# Patient Record
Sex: Female | Born: 1990 | Race: Black or African American | Hispanic: No | Marital: Single | Smoking: Never smoker
Health system: Southern US, Community
[De-identification: ages and names within clinical notes are randomized; demographics above are authoritative.]

## PROBLEM LIST (undated history)

## (undated) ENCOUNTER — Inpatient Hospital Stay (HOSPITAL_COMMUNITY): Payer: Self-pay

## (undated) HISTORY — PX: CHOLECYSTECTOMY: SHX55

---

## 2018-11-02 ENCOUNTER — Ambulatory Visit (INDEPENDENT_AMBULATORY_CARE_PROVIDER_SITE_OTHER): Payer: Self-pay | Admitting: *Deleted

## 2018-11-02 ENCOUNTER — Encounter: Payer: Self-pay | Admitting: Family Medicine

## 2018-11-02 DIAGNOSIS — Z3201 Encounter for pregnancy test, result positive: Secondary | ICD-10-CM

## 2018-11-02 DIAGNOSIS — Z3A01 Less than 8 weeks gestation of pregnancy: Secondary | ICD-10-CM

## 2018-11-02 LAB — POCT PREGNANCY, URINE: Preg Test, Ur: POSITIVE — AB

## 2018-11-02 NOTE — Progress Notes (Signed)
Pt here to confirm pregnancy. Denies any concerns - no pain or bleeding. LMP 09/18/2018 making her [redacted]w[redacted]d by LMP. She is not taking any meds. Encouraged to make appt to begin [facility] with provider of choice

## 2018-11-02 NOTE — Progress Notes (Signed)
I have reviewed the chart and agree with nursing staff's documentation of this patient's encounter.   Thressa Sheller DNP, CNM  11/02/18  3:47 PM

## 2018-11-11 ENCOUNTER — Inpatient Hospital Stay (HOSPITAL_COMMUNITY): Payer: Self-pay

## 2018-11-11 ENCOUNTER — Inpatient Hospital Stay (HOSPITAL_COMMUNITY)
Admission: AD | Admit: 2018-11-11 | Discharge: 2018-11-11 | Disposition: A | Discharge status: Home / Self Care | Payer: Self-pay | Attending: Obstetrics & Gynecology | Admitting: Obstetrics & Gynecology

## 2018-11-11 ENCOUNTER — Encounter (HOSPITAL_COMMUNITY): Payer: Self-pay | Admitting: *Deleted

## 2018-11-11 ENCOUNTER — Other Ambulatory Visit: Payer: Self-pay

## 2018-11-11 DIAGNOSIS — O26891 Other specified pregnancy related conditions, first trimester: Secondary | ICD-10-CM | POA: Insufficient documentation

## 2018-11-11 DIAGNOSIS — Z3A01 Less than 8 weeks gestation of pregnancy: Secondary | ICD-10-CM | POA: Insufficient documentation

## 2018-11-11 DIAGNOSIS — O26899 Other specified pregnancy related conditions, unspecified trimester: Secondary | ICD-10-CM

## 2018-11-11 DIAGNOSIS — R102 Pelvic and perineal pain: Secondary | ICD-10-CM | POA: Insufficient documentation

## 2018-11-11 DIAGNOSIS — B9689 Other specified bacterial agents as the cause of diseases classified elsewhere: Secondary | ICD-10-CM | POA: Insufficient documentation

## 2018-11-11 DIAGNOSIS — O23591 Infection of other part of genital tract in pregnancy, first trimester: Secondary | ICD-10-CM | POA: Insufficient documentation

## 2018-11-11 DIAGNOSIS — N760 Acute vaginitis: Secondary | ICD-10-CM

## 2018-11-11 LAB — ABO/RH: ABO/RH(D): A POS

## 2018-11-11 LAB — CBC
HCT: 35.3 % — ABNORMAL LOW (ref 36.0–46.0)
Hemoglobin: 11.2 g/dL — ABNORMAL LOW (ref 12.0–15.0)
MCH: 24.9 pg — ABNORMAL LOW (ref 26.0–34.0)
MCHC: 31.7 g/dL (ref 30.0–36.0)
MCV: 78.6 fL — ABNORMAL LOW (ref 80.0–100.0)
Platelets: 248 10*3/uL (ref 150–400)
RBC: 4.49 MIL/uL (ref 3.87–5.11)
RDW: 15.5 % (ref 11.5–15.5)
WBC: 14.4 10*3/uL — ABNORMAL HIGH (ref 4.0–10.5)
nRBC: 0 % (ref 0.0–0.2)

## 2018-11-11 LAB — URINALYSIS, ROUTINE W REFLEX MICROSCOPIC
Bilirubin Urine: NEGATIVE
Glucose, UA: NEGATIVE mg/dL
HGB URINE DIPSTICK: NEGATIVE
Ketones, ur: NEGATIVE mg/dL
Nitrite: NEGATIVE
Protein, ur: NEGATIVE mg/dL
Specific Gravity, Urine: 1.029 (ref 1.005–1.030)
pH: 5 (ref 5.0–8.0)

## 2018-11-11 LAB — WET PREP, GENITAL
Sperm: NONE SEEN
TRICH WET PREP: NONE SEEN
Yeast Wet Prep HPF POC: NONE SEEN

## 2018-11-11 LAB — HCG, QUANTITATIVE, PREGNANCY: hCG, Beta Chain, Quant, S: 40230 m[IU]/mL — ABNORMAL HIGH (ref <5)

## 2018-11-11 MED ORDER — METRONIDAZOLE 500 MG PO TABS: 500.0000 mg | ORAL_TABLET | Freq: Two times a day (BID) | ORAL | 0 refills | Status: AC

## 2018-11-11 NOTE — MAU Note (Addendum)
Pt presents to MAU c/o abdominal pain x2days that is sharp that is in the middle of her abdomen. Pt reports abdominal cramping as well that is lower. Pt denies bleeding or discharge. Pt reports she felt as if she was going to faint last night but not today.   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 Pt requesting help or possible shelter reference.   Triage Vitals:  BP: 119/67 Temp: 98.6 Pulse: 93 Pain: 0 right now  Weight:266lbs

## 2018-11-11 NOTE — Discharge Instructions (Signed)
First Trimester of Pregnancy ° °The first trimester of pregnancy is from week 1 until the end of week 13 (months 1 through 3). During this time, your baby will begin to develop inside you. At 6-8 weeks, the eyes and face are formed, and the heartbeat can be seen on ultrasound. At the end of 12 weeks, all the baby's organs are formed. Prenatal care is all the medical care you receive before the birth of your baby. Make sure you get good prenatal care and follow all of your doctor's instructions. °Follow these instructions at home: °Medicines °· Take over-the-counter and prescription medicines only as told by your doctor. Some medicines are safe and some medicines are not safe during pregnancy. °· Take a prenatal vitamin that contains at least 600 micrograms (mcg) of folic acid. °· If you have trouble pooping (constipation), take medicine that will make your stool soft (stool softener) if your doctor approves. °Eating and drinking ° °· Eat regular, healthy meals. °· Your doctor will tell you the amount of weight gain that is right for you. °· Avoid raw meat and uncooked cheese. °· If you feel sick to your stomach (nauseous) or throw up (vomit): °? Eat 4 or 5 small meals a day instead of 3 large meals. °? Try eating a few soda crackers. °? Drink liquids between meals instead of during meals. °· To prevent constipation: °? Eat foods that are high in fiber, like fresh fruits and vegetables, whole grains, and beans. °? Drink enough fluids to keep your pee (urine) clear or pale yellow. °Activity °· Exercise only as told by your doctor. Stop exercising if you have cramps or pain in your lower belly (abdomen) or low back. °· Do not exercise if it is too hot, too humid, or if you are in a place of great height (high altitude). °· Try to avoid standing for long periods of time. Move your legs often if you must stand in one place for a long time. °· Avoid heavy lifting. °· Wear low-heeled shoes. Sit and stand up  straight. °· You can have sex unless your doctor tells you not to. °Relieving pain and discomfort °· Wear a good support bra if your breasts are sore. °· Take warm water baths (sitz baths) to soothe pain or discomfort caused by hemorrhoids. Use hemorrhoid cream if your doctor says it is okay. °· Rest with your legs raised if you have leg cramps or low back pain. °· If you have puffy, bulging veins (varicose veins) in your legs: °? Wear support hose or compression stockings as told by your doctor. °? Raise (elevate) your feet for 15 minutes, 3-4 times a day. °? Limit salt in your food. °Prenatal care °· Schedule your prenatal visits by the twelfth week of pregnancy. °· Write down your questions. Take them to your prenatal visits. °· Keep all your prenatal visits as told by your doctor. This is important. °Safety °· Wear your seat belt at all times when driving. °· Make a list of emergency phone numbers. The list should include numbers for family, friends, the hospital, and police and fire departments. °General instructions °· Ask your doctor for a referral to a local prenatal class. Begin classes no later than at the start of month 6 of your pregnancy. °· Ask for help if you need counseling or if you need help with nutrition. Your doctor can give you advice or tell you where to go for help. °· Do not use hot tubs, steam   rooms, or saunas.  Do not douche or use tampons or scented sanitary pads.  Do not cross your legs for long periods of time.  Avoid all herbs and alcohol. Avoid drugs that are not approved by your doctor.  Do not use any tobacco products, including cigarettes, chewing tobacco, and electronic cigarettes. If you need help quitting, ask your doctor. You may get counseling or other support to help you quit.  Avoid cat litter boxes and soil used by cats. These carry germs that can cause birth defects in the baby and can cause a loss of your baby (miscarriage) or stillbirth.  Visit your dentist.  At home, brush your teeth with a soft toothbrush. Be gentle when you floss. Contact a doctor if:  You are dizzy.  You have mild cramps or pressure in your lower belly.  You have a nagging pain in your belly area.  You continue to feel sick to your stomach, you throw up, or you have watery poop (diarrhea).  You have a bad smelling fluid coming from your vagina.  You have pain when you pee (urinate).  You have increased puffiness (swelling) in your face, hands, legs, or ankles. Get help right away if:  You have a fever.  You are leaking fluid from your vagina.  You have spotting or bleeding from your vagina.  You have very bad belly cramping or pain.  You gain or lose weight rapidly.  You throw up blood. It may look like coffee grounds.  You are around people who have [place] measles, fifth disease, or chickenpox.  You have a very bad headache.  You have shortness of breath.  You have any kind of trauma, such as from a fall or a car accident. Summary  The first trimester of pregnancy is from week 1 until the end of week 13 (months 1 through 3).  To take care of yourself and your unborn baby, you will need to eat healthy meals, take medicines only if your doctor tells you to do so, and do activities that are safe for you and your baby.  Keep all follow-up visits as told by your doctor. This is important as your doctor will have to ensure that your baby is healthy and growing well. This information is not intended to replace advice given to you by your health care provider. Make sure you discuss any questions you have with your health care provider. Document Released: 01/29/2008 Document Revised: 08/20/2016 Document Reviewed: 08/20/2016 Elsevier Interactive Patient Education  2019 [employer]. [facility] Guide (Revised August 2014)    Insufficient Money for Medicine:           [address]: call "211"    MAP Program at [facility] Department - [place]  [phone] or [place] [phone]            No Primary Care Doctor:  To locate a primary care doctor that accepts your insurance or provides certain services:           [place] [facility]: [phone]           Physician Referral Service: [phone] ask for My [place]  If no insurance, you need to see if you qualify for [facility] orange card, call to set      up appointment for eligibility/enrollment at [phone] or [phone] or visit [facility]. of Health and [employer] ([address], [place] and [address] [place] -[place]) to meet with a [facility] enrollment specialist.  Agencies that  provide inexpensive (sliding fee scale) medical care:       [facility] - Family Medicine at [facility] [phone]     [facility]  -  [facility] Adult Center [phone]     [facility] Internal Medicine - [phone]     [facility] Care & Wellness - [phone]     [facility] [phone]     [facility] Family Practice [phone]  [facility] - [facility] Child Health @ [facility] - [phone]-     [phone]  [facility] - [facility] @ [place] - [phone]  [facility]: [phone]   Women's Clinic: [phone]   Planned Parenthood: [phone]   [facility] of the [place]    Medicaid-accepting [facility] Providers:           [facility] - [phone] (No Family Planning accepted)          [address], [phone], Mon-Fri 9am-5pm          [facility] - [phone]  [address] [place], [address] [place], Mon-Thursday 8am-5pm, Fri 8am-noon  [employer] - [phone]          [address], Mon-Fri 7:30am-4:30pm          [facility] - [phone]          [address] [place] 8am-5pm           [facility] - [phone] [address]          Only accepts [place] [employer] patients after they have their name applied to their card  Self Pay (no insurance) in [facility]:           Sickle Cell Patients:   [address] [place], [phone] [facility] Internal Medicine:  [address] [place] [phone]       [facility] and Wellness  [address] [place] [phone]  [facility] Family Practice:  [address], [phone]          [facility] Urgent Care           [address] [place], [phone] [facility] for Children  [address] [place], [phone]           [facility]gent [facility] [place]           [address], [place] [phone]        [facility] - [address]           [phone], Mon-Fri 9am-7pm, [place] 9am-1pm          [facility] - Family Medicine @ [facility]          [address] [place], [phone]          [facility] - [facility]           [address] [phone] [facility] - [facility]  [address] [place] [phone]          [facility]  [address], [phone]  [facility] - [facility]   [address] [place], [phone] [facility] - [facility]  [address] [phone]  Dr. Julio Sicks           [address], [place], [phone]          [facility] Urgent Care           [address] [phone]          [facility]             [address], [phone]          [facility]           [address] Medicine [place], [phone], 1st & 3rd Saturday every month, 10am-1pm OTHERS:  [employer]  (Immigration [employer] Only)  [phone]  (Thursday only) Strategies for finding a Primary Care Provider:  1) Find a Doctor and Pay Out of Pocket  Although you won't have to find out who is covered by your insurance plan, it is a good idea to ask around and get recommendations. You will then need to call the office and see if the doctor you have chosen will accept you as a new patient and what types of options they offer for patients who are self-pay. Some doctors offer discounts or will set up payment plans for their patients who do not have insurance, but you will need to ask so you aren't surprised when you get to your appointment.  2) Contact [place] [employer] - To see if you qualify for orange card access to healthcare safety net providers.  Call for appointment for eligibility/enrollment at [phone] or [phone]. (Uninsured, 0-200% FPL, qualifying info)  Applicants for [facility] are first required to see if they are eligible to enroll in the [facility] Marketplace before enrolling in [facility] (and get an exemption if they are not).  GCCN Criteria for acceptance is:  ? Proof of ACA Marketing exemption - form or documentation  ? Valid photo ID (driver's license, state identification card, passport, home country ID)  ? Proof of [facility] residency (e.g. drivers license, lease/landlord information, pay stubs with address, utility bill, bank statement, etc.)  ? Proof of income (1040, last year's tax return, W2, 4 current pay stubs, other income proof)  ? Proof of assets (current bank statement + 3 most recent, disability paperwork, life insurance info, tax value on autos, etc.)  3) Contact Your Local Health Department  Not all health departments have doctors that can see patients for sick visits, but many do, so it is worth a call to see if yours does. If you don't know where your local health department is, you can check in your phone book. The CDC also has a tool to help you locate your state's health department, and many  state websites also have listings of all of their local health departments.  4) Find a Walk-in Clinic  If your illness is not likely to be very severe or complicated, you may want to try a walk in clinic. These are popping up all over the country in pharmacies, drugstores, and shopping centers. They're usually staffed by nurse practitioners or physician assistants that have been trained to treat common illnesses and complaints. They're usually fairly quick and inexpensive. However, if you have serious medical issues or chronic medical problems, these are probably not  your best option   STD Testing:           [facility] [place], [facility] [facility]           [address], [place], phone [phone] or [phone]           Monday - Friday, call for an appointment          [facility] Department of [facility], [place] [facility]           [address], [place], phone [phone] or [phone]           Monday - Friday, call for an appointment Abuse/Neglect:           [facility] Child Abuse Hotline: 937-883-5524           [facility] Child Abuse Hotline: 312-388-1241 (After Hours) Emergency Shelter:  [facility] 564-292-8125  [facility] [place]- 956-286-5268  [facility] [place] - 307-117-0376  Youth Focus - Act Together - 807-008-1304 (ages 17-17)  Homeless Day Shelter @ [employer] - 204-782-4877   Mammograms - Free at [facility] [phone]  Maternity Homes:           Room at the [place] of the [facility]: [phone]   (Homeless mother with children)          [facility] Alert Services: [phone] (Mothers only)  Youth Focus: 780-388-9219 (Pregnant 50-4 years old)  Adopt a Mom -([phone]  [facility]   [facility] - Lanae Boast  [address] [place] [phone]          [facility]            [address] [place]           [phone]          [place]           [address] [place], [place]           [phone]          [facility] Dept.           [address], [place]           [phone]          [facility] Mental Health           [phone]          [facility]           [phone]          [facility] in [place]           [address]           [phone], [facility] Child Abuse Hotline           (304)293-7565           313-685-3370 (After Hours)  Behavioral Health Services /Substance Abuse Resources:           Alcohol and Drug Services: 2130265150           [facility]: [phone]          [facility]: [phone]   Narcotics Helpline - 7140711201  [facility]: 431-414-0011(336) [phone]           Residential & Outpatient Substance Abuse Program - [facility]: [phone]  [employer]  Behavioral Health and [facility] - [phone] Psychological Services:          [facility]: [phone]   Therapeutic Alternatives: [phone]          [facility]           [address] [place] [address], [place]           ACCESS LINE: [phone]     (24 Hour)  Mobile Crisis:   HELPLINES:  Financial risk analystational Alliance on Mental Illness - [place] 6181490522(336) [phone] [facility] - [place]  6505496320(800) [phone]  [facility] Services       [facility] - [address] - [place]  [phone]       [facility] - [phone] or [phone](336) [phone]  [facility] - [phone] S. [address] - [place] [phone]  [facility] [phone]- [address], [place] [phone](336) [phone]   Dental Assistance:  If unable to pay or uninsured, contact: [facility] to become qualified for the adult dental clinic.  Patient must be enrolled in [facility] (uninsured, 0-200% FPL, qualifying info).  Enroll in [facility] first, then see Primary Care Physician assigned to you, the PCP makes a dental referral. [place] [facility] will receive referral and contacts patient for appointment.  Patients with Medicaid           [address], [phone]  [facility] (Children up to 20 + Pregnant Women) - [phone](336) [phone]  [facility] - [address] - Suite [phone]  If unable to pay, or uninsured: contact [facility] [facility] [phone] in [place] - (Children only + Pregnant Women), [phone] in [facility]- Children only) to become qualified for the adult dental clinic  Must see if eligible to enroll in [facility] Health Insurance Marketplace before enrolling into the [facility] (exemption required) [phone] for an appointment)  BigFaster.co.ukwww.healthcare.gov;   [phone].  If not eligible for ACA, then go by Department of Health and Human Services to see if eligible for orange card.  [address], [place] and [phone]- [address].  Once you get an orange card, you will have a Primary Care home who will then refer you to dental if needed.     Other IT consultantLow-Cost Community Dental Services:   [facility] [phone]- [phone] (ext [phone])   [address]  Dr. Lawrence Marseillesivils - [phone]   [address]    [place] [employer] - [phone]   [address] [facility] [address]           [facility]           [address], [place], [place], [id number]           [phone], Ext. [phone]           2nd and 4th Thursday of the month at 6:30am (Simple extractions only - no wisdom teeth or surgery) First come/First serve -First 10 clients served           [facility]([place], [place] and [facility] [place] residents only)          [address], [place], [place], [address]           [phone]  [facility] Health Department           [phone]          [facility] Health Department          [phone]         [facility] Health Department - [facility]          [phone]   Transportation Options:  Ambulance - 911 - [phone] per ride Family Member to accompany patient (if stable) [place] Transit Authority - [phone]  PART - [phone]  Taxi - [phone] - [place]  SCAT - [phone] (Application required)  [facility] - [phone]

## 2018-11-11 NOTE — MAU Provider Note (Signed)
Chief Complaint: Abdominal Pain   First Provider Initiated Contact with Patient 11/11/18 2023        SUBJECTIVE HPI: Andrea Gutierrez is a 28 y.o. G1P0 at [redacted]w[redacted]d by LMP who presents to maternity admissions reporting intermittent sharp pains in Left lower abdomen and sometimes in epigastric area.  No bleeding.. She denies vaginal bleeding, vaginal itching/burning, urinary symptoms, h/a, dizziness, n/v, or fever/chills.    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  [facility] is not being supportive  Abdominal Pain  This is a new problem. The current episode started in the past 7 days. The onset quality is gradual. The problem occurs intermittently. The problem has been unchanged. The pain is mild. The quality of the pain is cramping. The abdominal pain does not radiate. Pertinent negatives include no constipation, diarrhea, dysuria, fever, frequency or headaches. Nothing aggravates the pain. The pain is relieved by nothing. She has tried nothing for the symptoms.   RN Note: Pt presents to MAU c/o abdominal pain x2days that is sharp that is in the middle of her abdomen. Pt reports abdominal cramping as well that is lower. Pt denies bleeding or discharge. Pt reports she felt as if she was going to faint last night but not today.   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 Pt requesting help or possible shelter reference.    History reviewed. No pertinent past medical history. Past Surgical History:  Procedure Laterality Date  . CHOLECYSTECTOMY     Social History   Socioeconomic History  . Marital status: Single    Spouse name: Not on file  . Number of children: Not on file  . Years of education: Not on file  . Highest education level: Not on file  Occupational History  . Not on file  Social Needs  . Financial resource strain: Not on file  . Food  insecurity:    Worry: Not on file    Inability: Not on file  . Transportation needs:    Medical: Not on file    Non-medical: Not on file  Tobacco Use  . Smoking status: Never Smoker  . Smokeless tobacco: Never Used  Substance and Sexual Activity  . Alcohol use: Never    Frequency: Never  . Drug use: Never  . Sexual activity: Yes    Birth control/protection: None  Lifestyle  . Physical activity:    Days per week: Not on file    Minutes per session: Not on file  . Stress: Not on file  Relationships  . Social connections:    Talks on phone: Not on file    Gets together: Not on file    Attends religious service: Not on file    Active member of club or organization: Not on file    Attends meetings of clubs or organizations: Not on file    Relationship status: Not on file  . Intimate partner violence:    Fear of current or ex partner: Not on file    Emotionally abused: Not on file    Physically abused: Not on file    Forced sexual activity: Not on file  Other Topics Concern  . Not on file  Social History Narrative  . Not on file   No current facility-administered medications on file prior to encounter.    No  current outpatient medications on file prior to encounter.   No Known Allergies  I have reviewed patient's Past Medical Hx, Surgical Hx, Family Hx, Social Hx, medications and allergies.   ROS:  Review of Systems  Constitutional: Negative for fever.  Gastrointestinal: Positive for abdominal pain. Negative for constipation and diarrhea.  Genitourinary: Negative for dysuria and frequency.  Neurological: Negative for headaches.   Review of Systems  Other systems negative   Physical Exam  Physical Exam Patient Vitals for the past 24 hrs:  BP Temp Temp src Pulse Resp Weight  11/11/18 1951 119/67 98.6 F (37 C) Oral 93 17 120.7 kg   Constitutional: Well-developed, well-nourished female in no acute distress.  Cardiovascular: normal rate Respiratory: normal  effort GI: Abd soft, non-tender. Pos BS x 4 MS: Extremities nontender, no edema, normal ROM Neurologic: Alert and oriented x 4.  GU: Neg CVAT.  PELVIC EXAM: Cervix pink, visually closed, without lesion, scant white creamy discharge, vaginal walls and external genitalia normal Bimanual exam: Cervix 0/long/high, firm, anterior, neg CMT, uterus nontender, nonenlarged, adnexa without tenderness, enlargement, or mass  LAB RESULTS Results for orders placed or performed during the hospital encounter of 11/11/18 (from the past 24 hour(s))  Urinalysis, Routine w reflex microscopic     Status: Abnormal   Collection Time: 11/11/18  7:46 PM  Result Value Ref Range   Color, Urine YELLOW YELLOW   APPearance HAZY (A) CLEAR   Specific Gravity, Urine 1.029 1.005 - 1.030   pH 5.0 5.0 - 8.0   Glucose, UA NEGATIVE NEGATIVE mg/dL   Hgb urine dipstick NEGATIVE NEGATIVE   Bilirubin Urine NEGATIVE NEGATIVE   Ketones, ur NEGATIVE NEGATIVE mg/dL   Protein, ur NEGATIVE NEGATIVE mg/dL   Nitrite NEGATIVE NEGATIVE   Leukocytes,Ua MODERATE (A) NEGATIVE   RBC / HPF 0-5 0 - 5 RBC/hpf   WBC, UA 11-20 0 - 5 WBC/hpf   Bacteria, UA RARE (A) NONE SEEN   Squamous Epithelial / LPF 0-5 0 - 5   Mucus PRESENT   hCG, quantitative, pregnancy     Status: Abnormal   Collection Time: 11/11/18  8:52 PM  Result Value Ref Range   hCG, Beta Chain, Quant, S 40,230 (H) <5 mIU/mL  CBC     Status: Abnormal   Collection Time: 11/11/18  8:52 PM  Result Value Ref Range   WBC 14.4 (H) 4.0 - 10.5 K/uL   RBC 4.49 3.87 - 5.11 MIL/uL   Hemoglobin 11.2 (L) 12.0 - 15.0 g/dL   HCT 79.3 (L) 90.3 - 00.9 %   MCV 78.6 (L) 80.0 - 100.0 fL   MCH 24.9 (L) 26.0 - 34.0 pg   MCHC 31.7 30.0 - 36.0 g/dL   RDW 23.3 00.7 - 62.2 %   Platelets 248 150 - 400 K/uL   nRBC 0.0 0.0 - 0.2 %  ABO/Rh     Status: None   Collection Time: 11/11/18  8:52 PM  Result Value Ref Range   ABO/RH(D) A POS    No rh immune globuloin      NOT A RH IMMUNE GLOBULIN  CANDIDATE, PT RH POSITIVE Performed at [facility], [address], [place], [place] [address]   Wet prep, genital     Status: Abnormal   Collection Time: 11/11/18 10:41 PM  Result Value Ref Range   Yeast Wet Prep HPF POC NONE SEEN NONE SEEN   Trich, Wet Prep NONE SEEN NONE SEEN   Clue Cells Wet Prep HPF POC  PRESENT (A) NONE SEEN   WBC, Wet Prep HPF POC MANY (A) NONE SEEN   Sperm NONE SEEN      IMAGING US Ob Comp Less 14 Wks  Result Date: 11/11/2018 CLINICAL DATA:  Pelvic pain for the past 2 days. Estimated gestational age of [redacted] weeks, 5 days by LMP. EXAM: OBSTETRIC <14 WK [place] AND TRANSVAGINAL OB US TECHNIQUE: Both transabdominal and transvaginal ultrasound examinations were performed for complete evaluation of the gestation as well as the maternal uterus, adnexal regions, and pelvic cul-de-sac. Transvaginal technique was performed to assess early pregnancy. COMPARISON:  None. FINDINGS: Intrauterine gestational sac: Single Yolk sac:  Visualized. Embryo:  Visualized. Cardiac Activity: Visualized. Heart Rate: 121 bpm CRL:  8.4 mm   6 w   5 d                  [place] EDC: 07/02/2019 Subchorionic hemorrhage:  None visualized. Maternal uterus/adnexae: Unremarkable.  Right corpus luteum. IMPRESSION: 1. Single, live intrauterine pregnancy with estimated gestational age of [redacted] weeks, 5 days. No acute abnormality. Electronically Signed   By: Obie Dredge M.D.   On: 11/11/2018 22:01   US Ob Transvaginal  Result Date: 11/11/2018 CLINICAL DATA:  Pelvic pain for the past 2 days. Estimated gestational age of [redacted] weeks, 5 days by LMP. EXAM: OBSTETRIC <14 WK [place] AND TRANSVAGINAL OB US TECHNIQUE: Both transabdominal and transvaginal ultrasound examinations were performed for complete evaluation of the gestation as well as the maternal uterus, adnexal regions, and pelvic cul-de-sac. Transvaginal technique was performed to assess early pregnancy. COMPARISON:  None. FINDINGS: Intrauterine gestational sac: Single Yolk  sac:  Visualized. Embryo:  Visualized. Cardiac Activity: Visualized. Heart Rate: 121 bpm CRL:  8.4 mm   6 w   5 d                  [place] EDC: 07/02/2019 Subchorionic hemorrhage:  None visualized. Maternal uterus/adnexae: Unremarkable.  Right corpus luteum. IMPRESSION: 1. Single, live intrauterine pregnancy with estimated gestational age of [redacted] weeks, 5 days. No acute abnormality. Electronically Signed   By: Obie Dredge M.D.   On: 11/11/2018 22:01    MAU Management/MDM: Ordered usual first trimester r/o ectopic labs.   Pelvic exam and cultures done Will check baseline Ultrasound to rule out ectopic.  This bleeding/pain can represent a normal pregnancy with bleeding, spontaneous abortion or even an ectopic which can be life-threatening.  The process as listed above helps to determine which of these is present.  Reviewed results of above with patient who is pleased List of agencies for help with housing given List of providers given Encouraged to seek prenatal care Picture given   ASSESSMENT Single intrauterine pregnancy at [redacted]w[redacted]d Pelvic pain in early pregnancy Bacterial vaginosis  PLAN Discharge home List of providers given Encouraged to seek prenatal care  Pt stable at time of discharge. Encouraged to return here or to other Urgent Care/ED if she develops worsening of symptoms, increase in pain, fever, or other concerning symptoms.    Wynelle Bourgeois CNM, MSN Certified Nurse-Midwife 11/11/2018  8:55 PM

## 2018-11-12 LAB — HIV ANTIBODY (ROUTINE TESTING W REFLEX): HIV Screen 4th Generation wRfx: NONREACTIVE

## 2018-11-14 LAB — GC/CHLAMYDIA PROBE AMP (~~LOC~~) NOT AT ARMC
Chlamydia: NEGATIVE
Neisseria Gonorrhea: NEGATIVE

## 2018-12-08 ENCOUNTER — Ambulatory Visit (INDEPENDENT_AMBULATORY_CARE_PROVIDER_SITE_OTHER): Payer: Self-pay | Admitting: *Deleted

## 2018-12-08 DIAGNOSIS — Z3400 Encounter for supervision of normal first pregnancy, unspecified trimester: Secondary | ICD-10-CM

## 2018-12-08 NOTE — Progress Notes (Signed)
[id number]  Called pt for New Ob intake phone appt and left message on her VM detailing the reason for my call.  [id number]  Second call to pt for scheduled phone appointment - got VM again. New Ob intake appointment will be rescheduled.

## 2018-12-21 ENCOUNTER — Telehealth: Payer: Self-pay | Admitting: Advanced Practice Midwife

## 2018-12-21 NOTE — Telephone Encounter (Signed)
The patient called in regarding the change in appointment. Stated she noticed a change via mychart however she did not talk to anyone. Found the new ob intake was no completed. Cancelled the appointment and informed the patient of being placed on the wait list for the next available intake. Apologized for the inconvience.

## 2018-12-22 ENCOUNTER — Encounter: Payer: Self-pay | Admitting: Student

## 2019-01-01 ENCOUNTER — Other Ambulatory Visit: Payer: Self-pay

## 2019-01-01 ENCOUNTER — Ambulatory Visit (INDEPENDENT_AMBULATORY_CARE_PROVIDER_SITE_OTHER): Payer: Self-pay | Admitting: General Practice

## 2019-01-01 DIAGNOSIS — Z3402 Encounter for supervision of normal first pregnancy, second trimester: Secondary | ICD-10-CM

## 2019-01-01 DIAGNOSIS — Z3400 Encounter for supervision of normal first pregnancy, unspecified trimester: Secondary | ICD-10-CM | POA: Insufficient documentation

## 2019-01-01 MED ORDER — PRENATAL PLUS 27-1 MG PO TABS: 1.0000 | ORAL_TABLET | Freq: Every day | ORAL | 11 refills | Status: AC

## 2019-01-01 NOTE — Progress Notes (Signed)
I connected with  Andrea Gutierrez on 01/01/19 at  8:15 AM EDT by [employer] and verified that I am speaking with the correct person using two identifiers.   I discussed the limitations, risks, security and privacy concerns of performing an evaluation and management service by telephone and the availability of in person appointments. I also discussed with the patient that there may be a patient responsible charge related to this service. The patient expressed understanding and agreed to proceed.  OB intake completed. Discussed new OB labs at appt, scheduled anatomy scan 6/15 @ 845, discussed mychart app sign up for results/future appts & babyscripts. Patient does not currently have insurance- will come by office to receive BP cuff and teaching. Offered appt 5/15 @ 940 and patient verbalized understanding. Patient will return to office 5/26 for new OB appt.   Marylynn Pearson, RN 01/01/2019  11:42 AM

## 2019-01-06 NOTE — Progress Notes (Signed)
Chart reviewed for nurse visit. Agree with plan of care.   Marylene Land, CNM 01/06/2019 12:46 PM

## 2019-01-08 ENCOUNTER — Ambulatory Visit: Payer: Self-pay

## 2019-01-11 ENCOUNTER — Ambulatory Visit: Payer: Self-pay

## 2019-01-19 ENCOUNTER — Encounter: Payer: Self-pay | Admitting: Student

## 2019-01-19 ENCOUNTER — Telehealth: Payer: Self-pay | Admitting: Student

## 2019-01-19 NOTE — Telephone Encounter (Signed)
Patient stated she had a family emergency and now lives in [place].

## 2019-02-08 ENCOUNTER — Ambulatory Visit (HOSPITAL_COMMUNITY): Payer: Self-pay

## 2020-06-07 IMAGING — US TRANSVAGINAL OB ULTRASOUND
1 series · 15 of 28 positions shown · non-contrast
Comparison: None.

CLINICAL DATA: Pelvic pain for the past 2 days. Estimated
gestational age of 7 weeks, 5 days by LMP.

EXAM:
OBSTETRIC <14 WK US AND TRANSVAGINAL OB US
TECHNIQUE: Both transabdominal and transvaginal ultrasound examinations were
performed for complete evaluation of the gestation as well as the
maternal uterus, adnexal regions, and pelvic cul-de-sac.
Transvaginal technique was performed to assess early pregnancy.

[Series 1: transvaginal ob ultrasound · 15 of 33 slices shown]
[im 1/33]
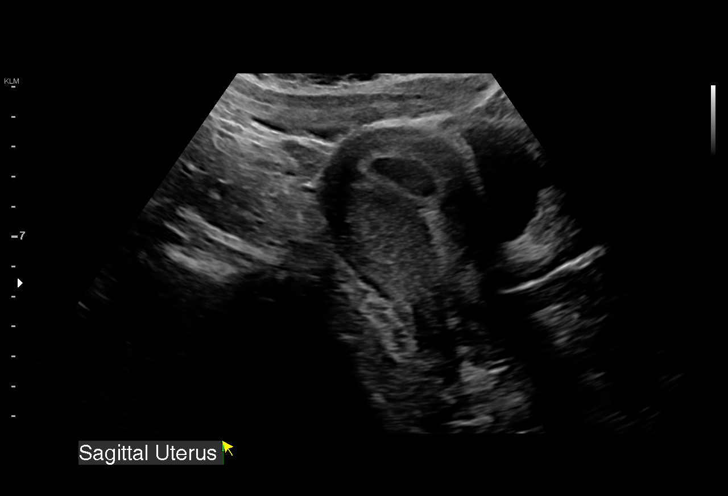
[im 3/33]
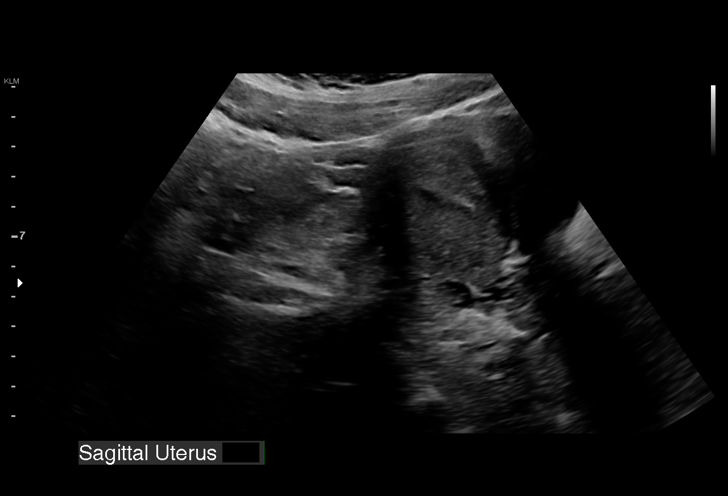
[im 5/33]
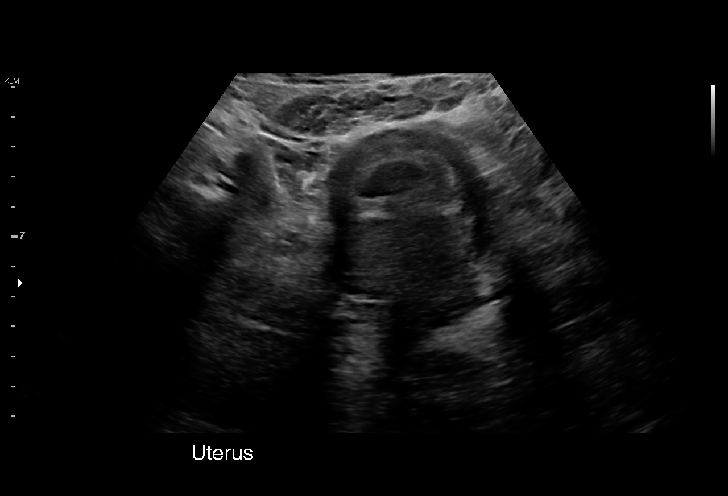
[im 8/33]
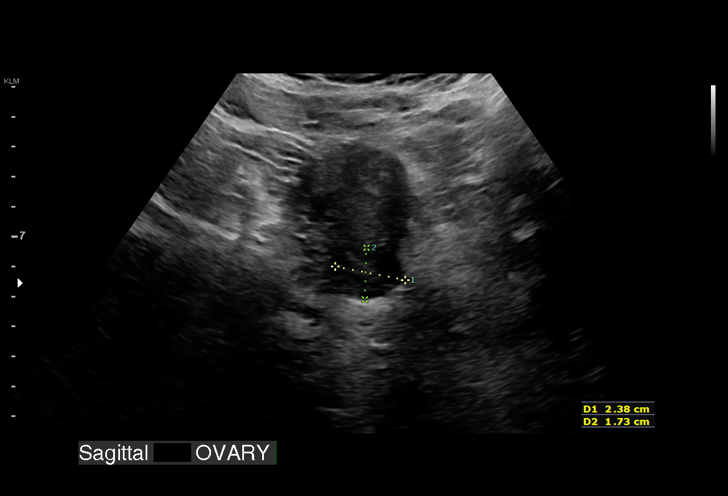
[im 10/33]
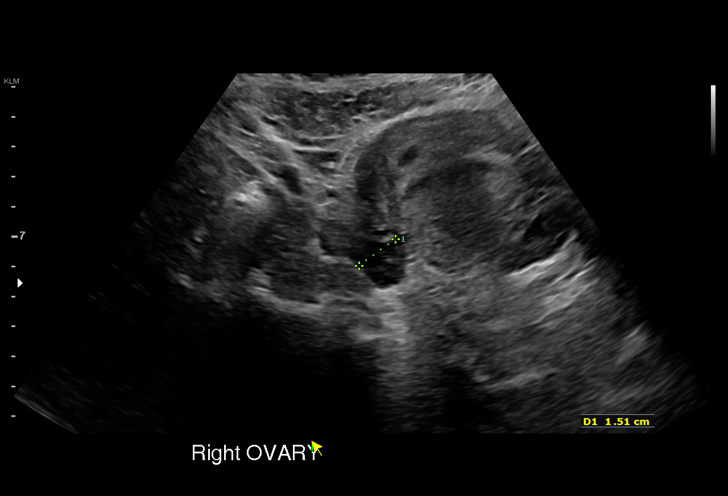
[im 12/33]
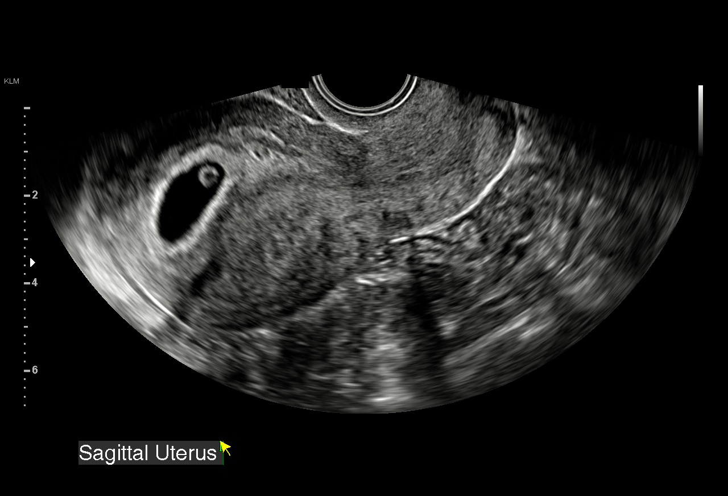
[im 15/33]
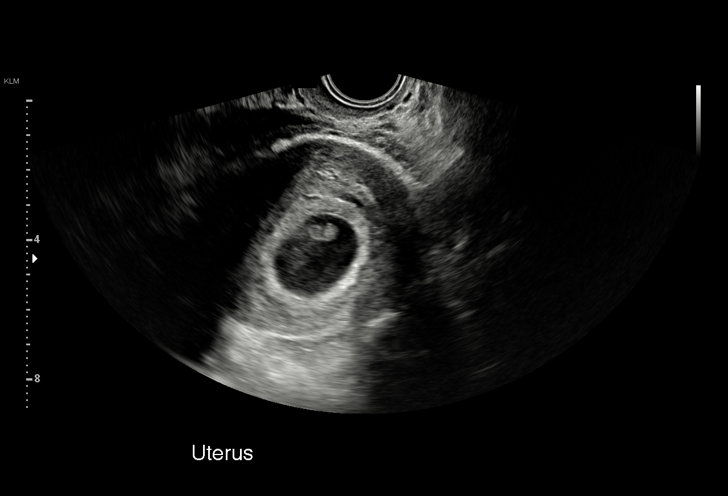
[im 17/33]
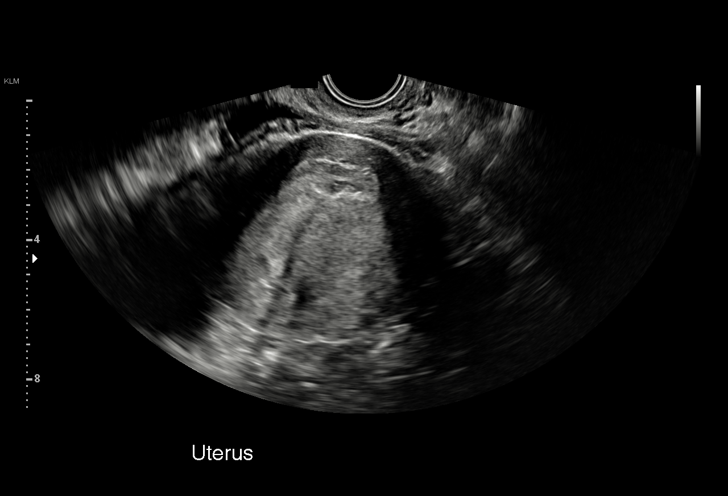
[im 18/33]
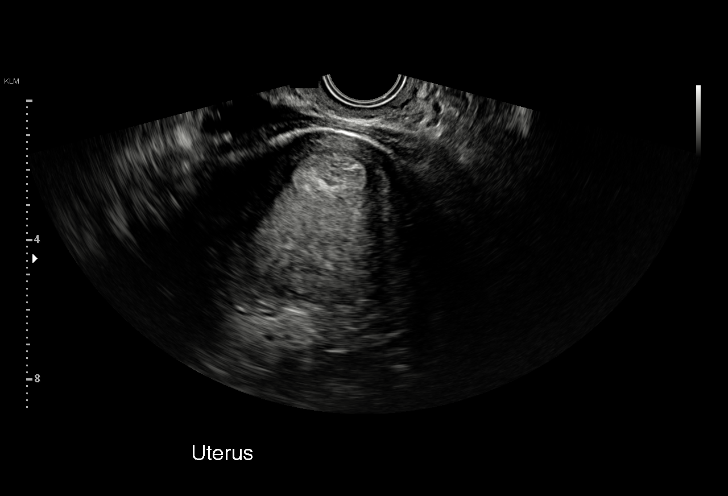
[im 21/33]
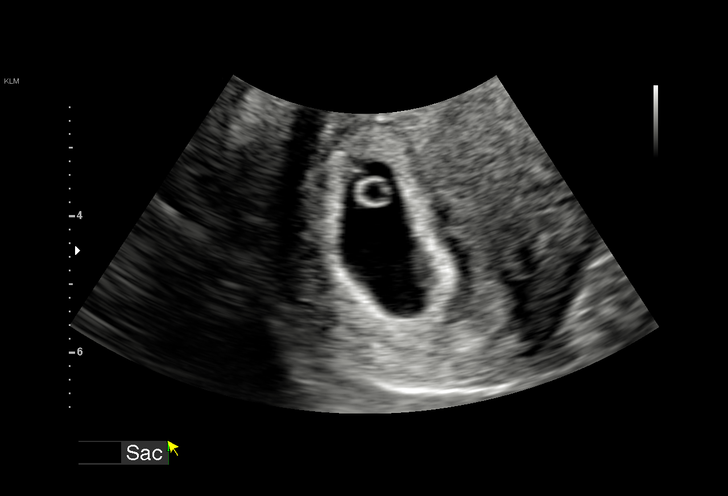
[im 23/33]
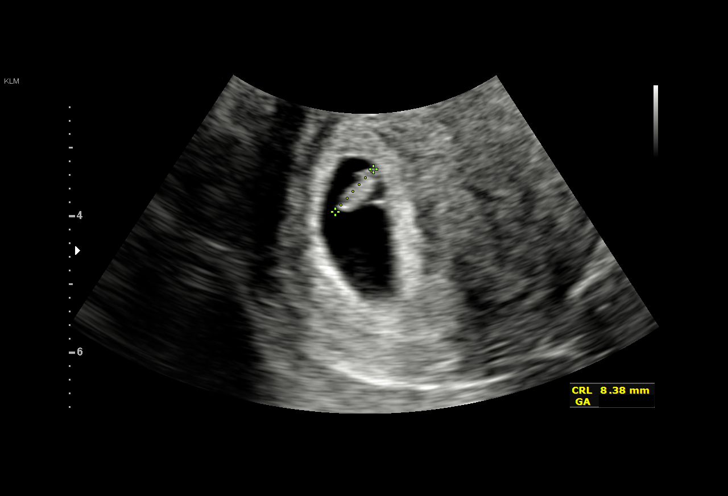
[im 25/33]
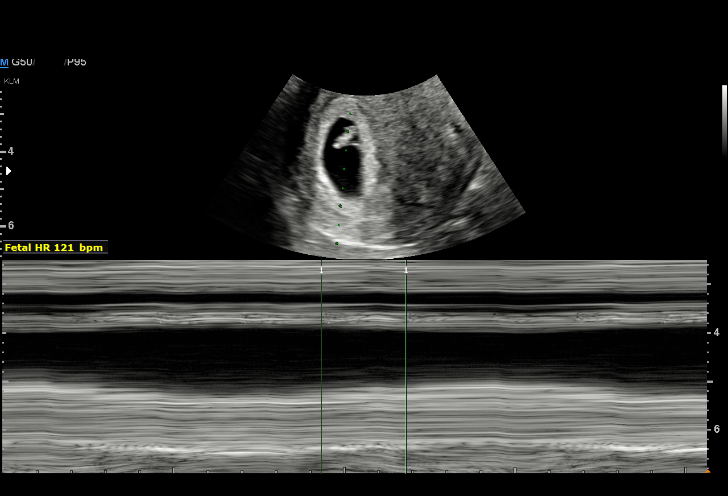
[im 28/33]
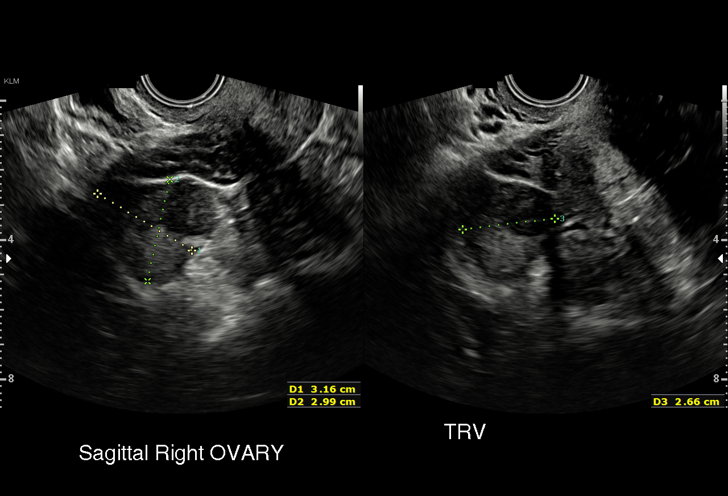
[im 30/33]
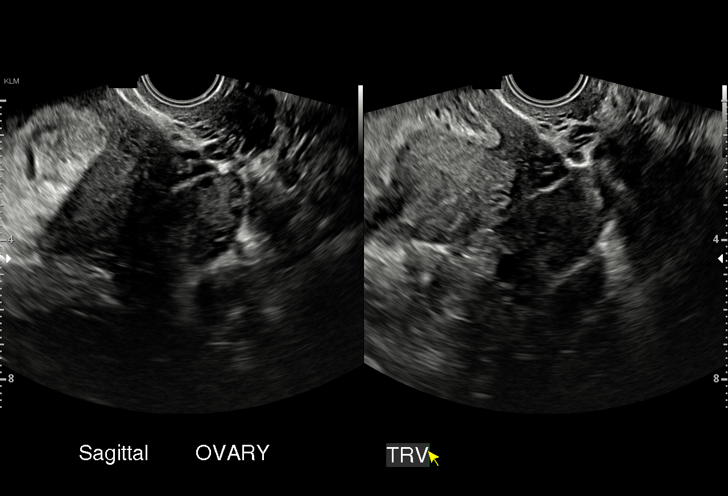
[im 33/33]
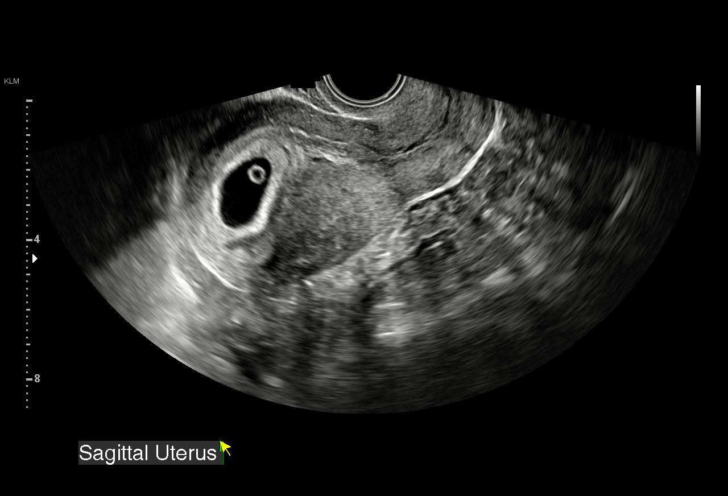

[15 of 28 positions shown; findings below may reference images not displayed]

FINDINGS: Intrauterine gestational sac: Single

Yolk sac:  Visualized.

Embryo:  Visualized.

Cardiac Activity: Visualized.

Heart Rate: 121 bpm

CRL:  8.4 mm   6 w   5 d                  US EDC: 07/02/2019

Subchorionic hemorrhage:  None visualized.

Maternal uterus/adnexae: Unremarkable.  Right corpus luteum.
IMPRESSION: 1. Single, live intrauterine pregnancy with estimated gestational
age of 6 weeks, 5 days. No acute abnormality.
